# Patient Record
Sex: Female | Born: 1995 | Race: White | Hispanic: No | Marital: Married | Smoking: Never smoker
Health system: Southern US, Community
[De-identification: ages and names within clinical notes are randomized; demographics above are authoritative.]

## PROBLEM LIST (undated history)

## (undated) DIAGNOSIS — Z789 Other specified health status: Secondary | ICD-10-CM

## (undated) HISTORY — PX: NO PAST SURGERIES: SHX2092

---

## 2015-11-30 ENCOUNTER — Emergency Department
Admission: EM | Admit: 2015-11-30 | Discharge: 2015-11-30 | Disposition: A | Discharge status: Home / Self Care | Payer: Medicaid Other | Attending: Emergency Medicine | Admitting: Emergency Medicine

## 2015-11-30 ENCOUNTER — Encounter: Payer: Self-pay | Admitting: Emergency Medicine

## 2015-11-30 ENCOUNTER — Emergency Department: Payer: Medicaid Other

## 2015-11-30 DIAGNOSIS — N898 Other specified noninflammatory disorders of vagina: Secondary | ICD-10-CM | POA: Diagnosis not present

## 2015-11-30 DIAGNOSIS — R1084 Generalized abdominal pain: Secondary | ICD-10-CM | POA: Diagnosis not present

## 2015-11-30 DIAGNOSIS — O26891 Other specified pregnancy related conditions, first trimester: Secondary | ICD-10-CM | POA: Diagnosis present

## 2015-11-30 DIAGNOSIS — Z3A01 Less than 8 weeks gestation of pregnancy: Secondary | ICD-10-CM | POA: Insufficient documentation

## 2015-11-30 DIAGNOSIS — O219 Vomiting of pregnancy, unspecified: Secondary | ICD-10-CM | POA: Diagnosis not present

## 2015-11-30 DIAGNOSIS — R1030 Lower abdominal pain, unspecified: Secondary | ICD-10-CM

## 2015-11-30 DIAGNOSIS — R300 Dysuria: Secondary | ICD-10-CM | POA: Insufficient documentation

## 2015-11-30 LAB — URINALYSIS COMPLETE WITH MICROSCOPIC (ARMC ONLY)
Bilirubin Urine: NEGATIVE
Glucose, UA: NEGATIVE mg/dL
Ketones, ur: NEGATIVE mg/dL
Leukocytes, UA: NEGATIVE
Nitrite: NEGATIVE
Protein, ur: NEGATIVE mg/dL
Specific Gravity, Urine: 1.008 (ref 1.005–1.030)
pH: 5 (ref 5.0–8.0)

## 2015-11-30 LAB — COMPREHENSIVE METABOLIC PANEL WITH GFR
ALT: 13 U/L — ABNORMAL LOW (ref 14–54)
AST: 20 U/L (ref 15–41)
Albumin: 4.1 g/dL (ref 3.5–5.0)
Alkaline Phosphatase: 66 U/L (ref 38–126)
Anion gap: 8 (ref 5–15)
BUN: 16 mg/dL (ref 6–20)
CO2: 23 mmol/L (ref 22–32)
Calcium: 9.2 mg/dL (ref 8.9–10.3)
Chloride: 105 mmol/L (ref 101–111)
Creatinine, Ser: 0.82 mg/dL (ref 0.44–1.00)
GFR calc Af Amer: >60 mL/min
GFR calc non Af Amer: >60 mL/min
Glucose, Bld: 80 mg/dL (ref 65–99)
Potassium: 3.6 mmol/L (ref 3.5–5.1)
Sodium: 136 mmol/L (ref 135–145)
Total Bilirubin: 0.4 mg/dL (ref 0.3–1.2)
Total Protein: 7 g/dL (ref 6.5–8.1)

## 2015-11-30 LAB — CBC
HCT: 36.3 % (ref 35.0–47.0)
HEMOGLOBIN: 12.8 g/dL (ref 12.0–16.0)
MCH: 32.5 pg (ref 26.0–34.0)
MCHC: 35.3 g/dL (ref 32.0–36.0)
MCV: 91.9 fL (ref 80.0–100.0)
PLATELETS: 303 10*3/uL (ref 150–440)
RBC: 3.95 MIL/uL (ref 3.80–5.20)
RDW: 13.1 % (ref 11.5–14.5)
WBC: 8.6 10*3/uL (ref 3.6–11.0)

## 2015-11-30 LAB — TYPE AND SCREEN
ABO/RH(D): O POS
Antibody Screen: NEGATIVE

## 2015-11-30 LAB — HCG, QUANTITATIVE, PREGNANCY: hCG, Beta Chain, Quant, S: 15102 m[IU]/mL — ABNORMAL HIGH

## 2015-11-30 MED ORDER — ONDANSETRON HCL 4 MG/2ML IJ SOLN
4.0000 mg | Freq: Once | INTRAMUSCULAR | Status: AC
  Administered 2015-11-30: 4 mg via INTRAVENOUS
  Filled 2015-11-30: qty 2

## 2015-11-30 MED ORDER — SODIUM CHLORIDE 0.9 % IV BOLUS (SEPSIS)
1000.0000 mL | Freq: Once | INTRAVENOUS | Status: AC
  Administered 2015-11-30: 1000 mL via INTRAVENOUS

## 2015-11-30 NOTE — ED Provider Notes (Signed)
[facility] Emergency Department Provider Note  Time seen: 5:44 PM  I have reviewed the triage vital signs and the nursing notes.   HISTORY  Chief Complaint Abdominal Pain    HPI Norma Daniel is a 20 y.o. female with no past medical history approximately 5-[redacted] weeks pregnant by LMP who presents to the emergency department sudden onset of lower abdominal pain. According to the patient one hour prior to arrival she developed sudden onset of severe lower abdominal pain. States nausea with several episodes of vomiting. Denies diarrhea. He does state mild dysuria over the past several days. She has not yet seen an OB as she just found out she was pregnant last week. Denies any prior pregnancies, miscarriages or abortions. Denies fever.  History reviewed. No pertinent past medical history.  There are no active problems to display for this patient.   No past surgical history on file.  Prior to Admission medications   Not on File    No Known Allergies  No family history on file.  Social History Social History  Substance Use Topics  . Smoking status: Not on file  . Smokeless tobacco: Not on file  . Alcohol use Not on file    Review of Systems Constitutional: Negative for fever. Cardiovascular: Negative for chest pain. Respiratory: Negative for shortness of breath. Gastrointestinal: Severe lower abdominal pain. Positive for nausea and vomiting. Negative for diarrhea. Genitourinary: Mild dysuria. Musculoskeletal: Negative for back pain. Neurological: Negative for headache 10-point ROS otherwise negative.  ____________________________________________   PHYSICAL EXAM:  VITAL SIGNS: ED Triage Vitals [11/30/15 1713]  Enc Vitals Group     BP 131/69     Pulse Rate 100     Resp 18     Temp 98.1 F (36.7 C)     Temp Source Oral     SpO2 100 %     Weight 140 lb (63.5 kg)     Height 5\' 2"  (1.575 m)     Head Circumference      Peak Flow      Pain  Score 7     Pain Loc      Pain Edu?      Excl. in GC?     Constitutional: Alert and oriented. Well appearing and in no distress. Eyes: Normal exam ENT   Head: Normocephalic and atraumatic.   Mouth/Throat: Mucous membranes are moist. Cardiovascular: Normal rate, regular rhythm. No murmurs, Respiratory: Normal respiratory effort without tachypnea nor retractions. Breath sounds are clear  Gastrointestinal: Soft, moderate diffuse abdominal tenderness palpation. No rebound or guarding. No distention. States the pain is somewhat worse with lower abdominal palpation but is tender throughout the abdomen. Musculoskeletal: Nontender with normal range of motion in all extremities.  Neurologic:  Normal speech and language. No gross focal neurologic deficits a Skin:  Skin is warm, dry and intact.  Psychiatric: Mood and affect are normal.   ____________________________________________    RADIOLOGY  Ultrasound shows 5 week 5 day IUP.  ____________________________________________   INITIAL IMPRESSION / ASSESSMENT AND PLAN / ED COURSE  Pertinent labs & imaging results that were available during my care of the patient were reviewed by me and considered in my medical decision making (see chart for details).  The patient presents to the emergency department lower abdominal pain, sudden onset 1 hour ago. Patient is approximately [redacted] weeks pregnant. We will check labs, ultrasound to further evaluate. Bedside ultrasound appears to show a gestational sac within the uterus, no significant free  fluid identified, negative FAST exam.  Ultrasound shows 5 week 5 day IUP. Otherwise normal ultrasound. Patient states the pain is gone. Blood work is largely within normal limits. Currently awaiting urinalysis results.  Urinalysis is negative. We'll discharge the OB follow-up. Patient agreeable to plan.  ____________________________________________   FINAL CLINICAL IMPRESSION(S) / ED  DIAGNOSES  Abdominal pain during pregnancy    Minna AntisKevin Cheresa Siers, MD 11/30/15 2039

## 2015-11-30 NOTE — Discharge Instructions (Signed)
Please call OB/GYN to arrange follow-up appointments as possible. Return to the emergency department for any worsening abdominal pain, Vaginal bleeding, or any other symptom personally concerning to yourself.

## 2015-11-30 NOTE — ED Triage Notes (Addendum)
Pt presents to ED with reports of sudden onset generalized abdominal pain that began approximately one hour ago. Pt reports she is pregnant but does not know gestational age. Pt reports positive pregnancy test at home. Pt denies vaginal bleeding.

## 2016-01-06 NOTE — L&D Delivery Note (Signed)
Delivery Note At 5:05 PM a viable and healthy female "Norma Daniel" was delivered via Vaginal, Spontaneous Delivery (Presentation: OA  ).  APGAR: 9, 9; weight  .   Placenta status:spontaneous, intact  Cord:3vc  with the following complications: tight nuchal.    Anesthesia:  local Episiotomy: None Lacerations:  1st deg in the post fornix, not including the perineal skin. Suture Repair: 3.0 vicryl Est. Blood Loss (mL):  300  Mom to postpartum.  Baby to Couplet care / Skin to Skin.  21yo G1P0 at 38+3wks presenting with PROM for clear fluid at 2pm. She was given buccal cytotec and proceeded to active labor without further augmentation. She began to involuntarily push at 9cm and with a forebag, which was ruptured. She then delivered the fetal head and promptly the fetal body, delivering right through the nuchal cord. There was terminal mec and the baby was passed to maternal abdomen. When cord pulsing subsided, the cord was clamped and cut by FOB. A small vaginal tear was repaired with 2 interuptted stiches and 10ml 1% lidocaine. Her placenta delivered spontaneously and was intact. A speculum was used to evaluate the cervix, which appeared intact. Her fundus was firm and bleeding minimal, with postpartum pitocin running. Everybody tolerated the procedure well.  Norma Daniel 07/12/2016, 5:28 PM

## 2016-01-11 ENCOUNTER — Emergency Department
Admission: EM | Admit: 2016-01-11 | Discharge: 2016-01-11 | Disposition: A | Discharge status: Home / Self Care | Payer: Medicaid Other | Attending: Emergency Medicine | Admitting: Emergency Medicine

## 2016-01-11 ENCOUNTER — Encounter: Payer: Self-pay | Admitting: Emergency Medicine

## 2016-01-11 ENCOUNTER — Emergency Department: Payer: Medicaid Other

## 2016-01-11 DIAGNOSIS — Z3A12 12 weeks gestation of pregnancy: Secondary | ICD-10-CM | POA: Insufficient documentation

## 2016-01-11 DIAGNOSIS — O26891 Other specified pregnancy related conditions, first trimester: Secondary | ICD-10-CM | POA: Insufficient documentation

## 2016-01-11 DIAGNOSIS — R109 Unspecified abdominal pain: Secondary | ICD-10-CM

## 2016-01-11 DIAGNOSIS — R102 Pelvic and perineal pain: Secondary | ICD-10-CM | POA: Diagnosis not present

## 2016-01-11 LAB — URINALYSIS, COMPLETE (UACMP) WITH MICROSCOPIC
BACTERIA UA: NONE SEEN
BILIRUBIN URINE: NEGATIVE
Glucose, UA: NEGATIVE mg/dL
HGB URINE DIPSTICK: NEGATIVE
Ketones, ur: 5 mg/dL — AB
Leukocytes, UA: NEGATIVE
NITRITE: NEGATIVE
PROTEIN: NEGATIVE mg/dL
SPECIFIC GRAVITY, URINE: 1.005 (ref 1.005–1.030)
pH: 6 (ref 5.0–8.0)

## 2016-01-11 LAB — LIPASE, BLOOD: LIPASE: 23 U/L (ref 11–51)

## 2016-01-11 LAB — COMPREHENSIVE METABOLIC PANEL
ALBUMIN: 3.2 g/dL — AB (ref 3.5–5.0)
ALT: 12 U/L — ABNORMAL LOW (ref 14–54)
ANION GAP: 8 (ref 5–15)
AST: 21 U/L (ref 15–41)
Alkaline Phosphatase: 41 U/L (ref 38–126)
BILIRUBIN TOTAL: 0.4 mg/dL (ref 0.3–1.2)
BUN: 11 mg/dL (ref 6–20)
CHLORIDE: 105 mmol/L (ref 101–111)
CO2: 21 mmol/L — AB (ref 22–32)
Calcium: 8.7 mg/dL — ABNORMAL LOW (ref 8.9–10.3)
Creatinine, Ser: 0.58 mg/dL (ref 0.44–1.00)
GFR calc Af Amer: >60 mL/min (ref >60)
GFR calc non Af Amer: >60 mL/min (ref >60)
GLUCOSE: 96 mg/dL (ref 65–99)
POTASSIUM: 3.6 mmol/L (ref 3.5–5.1)
SODIUM: 134 mmol/L — AB (ref 135–145)
Total Protein: 6 g/dL — ABNORMAL LOW (ref 6.5–8.1)

## 2016-01-11 LAB — CBC
HEMATOCRIT: 33.2 % — AB (ref 35.0–47.0)
Hemoglobin: 11.7 g/dL — ABNORMAL LOW (ref 12.0–16.0)
MCH: 32.2 pg (ref 26.0–34.0)
MCHC: 35.4 g/dL (ref 32.0–36.0)
MCV: 91.1 fL (ref 80.0–100.0)
Platelets: 191 10*3/uL (ref 150–440)
RBC: 3.64 MIL/uL — ABNORMAL LOW (ref 3.80–5.20)
RDW: 13.2 % (ref 11.5–14.5)
WBC: 6 10*3/uL (ref 3.6–11.0)

## 2016-01-11 LAB — HCG, QUANTITATIVE, PREGNANCY: HCG, BETA CHAIN, QUANT, S: 102198 m[IU]/mL — AB (ref <5)

## 2016-01-11 LAB — ABO/RH: ABO/RH(D): O POS

## 2016-01-11 NOTE — ED Notes (Signed)
Patient transported to Ultrasound 

## 2016-01-11 NOTE — ED Triage Notes (Signed)
Pt to rm 11 via EMS, ems report n/v/d and lower abd pain x 2 days, reports looked pale, possible near syncopal episode.  Pt [redacted] weeks pregnant.  VSS, BG 149.

## 2016-01-11 NOTE — ED Provider Notes (Signed)
[facility] Emergency Department Provider Note   ____________________________________________    I have reviewed the triage vital signs and the nursing notes.   HISTORY  Chief Complaint Abdominal Pain; Emesis; and Diarrhea     HPI Norma Daniel is a 21 y.o. female who presents with complaints of abdominal pain. Patient reports worsening lower abdominal pain over the past 2 days. She also reports nausea vomiting and diarrhea. She denies history of abdominal surgery. She is [redacted] weeks pregnant. She did have an ultrasound 1 month ago which demonstrated an IUP. She denies vaginal bleeding. No fevers or chills reported. No sick contacts reported. No dysuria reported   History reviewed. No pertinent past medical history.  There are no active problems to display for this patient.   History reviewed. No pertinent surgical history.  Prior to Admission medications   Not on File     Allergies Patient has no known allergies.  History reviewed. No pertinent family history.  Social History Social History  Substance Use Topics  . Smoking status: Never Smoker  . Smokeless tobacco: Never Used  . Alcohol use No    Review of Systems  Constitutional: No fever/chills  ENT: No sore throat. Cardiovascular: Denies chest pain. Respiratory: Denies Cough Gastrointestinal:As above  Genitourinary: Negative for dysuria. Musculoskeletal: Negative for back pain. Skin: Negative for rash. Neurological: Negative for headaches   10-point ROS otherwise negative.  ____________________________________________   PHYSICAL EXAM:  VITAL SIGNS: ED Triage Vitals  Enc Vitals Group     BP 01/11/16 0707 101/73     Pulse Rate 01/11/16 0707 83     Resp 01/11/16 0707 20     Temp 01/11/16 0707 98.1 F (36.7 C)     Temp Source 01/11/16 0707 Oral     SpO2 01/11/16 0707 99 %     Weight 01/11/16 0704 150 lb (68 kg)     Height 01/11/16 0704 5\' 2"  (1.575 m)     Head  Circumference --      Peak Flow --      Pain Score 01/11/16 0715 8     Pain Loc --      Pain Edu? --      Excl. in GC? --     Constitutional: Alert and oriented. No acute distress. Pleasant and interactive Eyes: Conjunctivae are normal.  Head: Atraumatic. Nose: No congestion/rhinnorhea. Mouth/Throat: Mucous membranes are moist.    Cardiovascular: Normal rate, regular rhythm. Grossly normal heart sounds.  Good peripheral circulation. Respiratory: Normal respiratory effort.  No retractions. Lungs CTAB. Gastrointestinal: Mild tenderness in the right lower quadrant and left lower quadrant. No distention.  No CVA tenderness. Genitourinary: deferred Musculoskeletal: No lower extremity tenderness nor edema.  Warm and well perfused Neurologic:  Normal speech and language.   Skin:  Skin is warm, dry and intact. No rash noted. Psychiatric: Mood and affect are normal. Speech and behavior are normal.  ____________________________________________   LABS (all labs ordered are listed, but only abnormal results are displayed)  Labs Reviewed  CBC - Abnormal; Notable for the following:       Result Value   RBC 3.64 (*)    Hemoglobin 11.7 (*)    HCT 33.2 (*)    All other components within normal limits  LIPASE, BLOOD  COMPREHENSIVE METABOLIC PANEL  URINALYSIS, COMPLETE (UACMP) WITH MICROSCOPIC  HCG, QUANTITATIVE, PREGNANCY  ABO/RH   ____________________________________________  EKG  None ____________________________________________  RADIOLOGY  Ultrasound is unremarkable ____________________________________________   PROCEDURES  Procedure(s) performed: No    Critical Care performed: No ____________________________________________   INITIAL IMPRESSION / ASSESSMENT AND PLAN / ED COURSE  Pertinent labs & imaging results that were available during my care of the patient were reviewed by me and considered in my medical decision making (see chart for details).  Patient with  lower abdominal cramping, bilaterally. Differential includes miscarriage, appendicitis, gastroenteritis. We will check labs and ultrasound to start and consider more advanced imaging if necessary  ----------------------------------------- 10:57 AM on 01/11/2016 -----------------------------------------  Lab tests are reassuring, urine is normal, ultrasound unremarkable although right ovary visualized unfortunately. However the patient reports her pain has essentially resolved. I suspect viral illness primarily. On reexam she has no tenderness to palpation. Do not feel imaging is necessary at this time, we did discuss return precautions at length. ____________________________________________   FINAL CLINICAL IMPRESSION(S) / ED DIAGNOSES  Final diagnoses:  Pelvic pain  Abdominal pain during pregnancy in first trimester      NEW MEDICATIONS STARTED DURING THIS VISIT:  New Prescriptions   No medications on file     Note:  This document was prepared using Dragon voice recognition software and may include unintentional dictation errors.    Jene Every, MD 01/11/16 1058

## 2016-01-11 NOTE — ED Notes (Signed)
Put hat in pt's toilet and told her we needed urine; pt unable to void at this time.

## 2016-01-11 NOTE — ED Notes (Addendum)
E-signature pad not working. Pt states she has a F/U appt on Tuesday with OB.

## 2016-01-17 ENCOUNTER — Other Ambulatory Visit: Payer: Self-pay | Admitting: Family Medicine

## 2016-01-17 DIAGNOSIS — Z3401 Encounter for supervision of normal first pregnancy, first trimester: Secondary | ICD-10-CM

## 2016-01-27 ENCOUNTER — Other Ambulatory Visit: Payer: Self-pay | Admitting: Family Medicine

## 2016-01-27 ENCOUNTER — Ambulatory Visit
Admission: RE | Admit: 2016-01-27 | Discharge: 2016-01-27 | Disposition: A | Discharge status: Home / Self Care | Payer: Medicaid Other | Source: Ambulatory Visit | Attending: Family Medicine | Admitting: Family Medicine

## 2016-01-27 DIAGNOSIS — Z3689 Encounter for other specified antenatal screening: Secondary | ICD-10-CM

## 2016-01-27 DIAGNOSIS — Z3401 Encounter for supervision of normal first pregnancy, first trimester: Secondary | ICD-10-CM

## 2016-03-02 ENCOUNTER — Ambulatory Visit
Admission: RE | Admit: 2016-03-02 | Discharge: 2016-03-02 | Disposition: A | Discharge status: Home / Self Care | Payer: Medicaid Other | Source: Ambulatory Visit | Attending: Family Medicine | Admitting: Family Medicine

## 2016-03-02 DIAGNOSIS — Z3402 Encounter for supervision of normal first pregnancy, second trimester: Secondary | ICD-10-CM | POA: Insufficient documentation

## 2016-03-02 DIAGNOSIS — Z3689 Encounter for other specified antenatal screening: Secondary | ICD-10-CM

## 2016-03-02 DIAGNOSIS — Z3A19 19 weeks gestation of pregnancy: Secondary | ICD-10-CM | POA: Insufficient documentation

## 2016-07-11 ENCOUNTER — Inpatient Hospital Stay
Admission: EM | Admit: 2016-07-11 | Discharge: 2016-07-14 | DRG: 775 | Disposition: A | Discharge status: Home / Self Care | Payer: Medicaid Other | Attending: Obstetrics and Gynecology | Admitting: Obstetrics and Gynecology

## 2016-07-11 DIAGNOSIS — O4290 Premature rupture of membranes, unspecified as to length of time between rupture and onset of labor, unspecified weeks of gestation: Principal | ICD-10-CM | POA: Diagnosis present

## 2016-07-11 DIAGNOSIS — Z3A38 38 weeks gestation of pregnancy: Secondary | ICD-10-CM

## 2016-07-11 DIAGNOSIS — D72829 Elevated white blood cell count, unspecified: Secondary | ICD-10-CM | POA: Diagnosis present

## 2016-07-11 LAB — ROM PLUS (ARMC ONLY): Rom Plus: POSITIVE

## 2016-07-11 MED ORDER — MISOPROSTOL 25 MCG QUARTER TABLET
25.0000 ug | ORAL_TABLET | ORAL | Status: DC | PRN
  Administered 2016-07-12 (×2): 25 ug via BUCCAL
  Filled 2016-07-11 (×2): qty 1

## 2016-07-11 MED ORDER — DOCUSATE SODIUM 100 MG PO CAPS
100.0000 mg | ORAL_CAPSULE | Freq: Every day | ORAL | Status: DC
  Administered 2016-07-13: 100 mg via ORAL
  Filled 2016-07-11: qty 1

## 2016-07-11 MED ORDER — ZOLPIDEM TARTRATE 5 MG PO TABS
5.0000 mg | ORAL_TABLET | Freq: Every evening | ORAL | Status: DC | PRN
Start: 2016-07-11 — End: 2016-07-12

## 2016-07-11 MED ORDER — TERBUTALINE SULFATE 1 MG/ML IJ SOLN: 0.2500 mg | Freq: Once | INTRAMUSCULAR | Status: DC | PRN

## 2016-07-11 MED ORDER — PRENATAL PLUS 27-1 MG PO TABS
1.0000 | ORAL_TABLET | Freq: Every day | ORAL | Status: DC
  Administered 2016-07-13: 1 via ORAL
  Filled 2016-07-11 (×4): qty 1

## 2016-07-11 MED ORDER — ACETAMINOPHEN 325 MG PO TABS
650.0000 mg | ORAL_TABLET | ORAL | Status: DC | PRN
  Administered 2016-07-13: 650 mg via ORAL
  Filled 2016-07-11: qty 2

## 2016-07-11 MED ORDER — OXYTOCIN 40 UNITS IN LACTATED RINGERS INFUSION - SIMPLE MED: 1.0000 m[IU]/min | INTRAVENOUS | Status: DC

## 2016-07-11 MED ORDER — CALCIUM CARBONATE ANTACID 500 MG PO CHEW: 2.0000 | CHEWABLE_TABLET | ORAL | Status: DC | PRN

## 2016-07-11 NOTE — H&P (Signed)
OB ADMISSION/ HISTORY & PHYSICAL:  Admission Date: 07/11/2016 10:24 PM  Admit Diagnosis: PROM at 38wks  Norma Daniel is a 21 y.o. female presenting for PROM for clear fluid at 1400 on 07/11/16.  Prenatal History: G1P0   EDC : 07/23/2016,  Primary Ob Provider: Phineas Realharles Drew Prenatal course complicated by nothing per patient, but we don't have access to any labs today  Prenatal Labs: ABO, Rh: --/--/O POS (07/08 0335) Antibody: NEG (07/08 0335) Rubella:    RPR:    HBsAg: Negative (07/08 0000)  HIV: Non-reactive (07/08 0000)  GTT: unknown GBS:   unknown  Medical / Surgical History :  Past medical history: History reviewed. No pertinent past medical history.   Past surgical history: History reviewed. No pertinent surgical history.  Family History: No family history on file.   Social History:  reports that she has never smoked. She has never used smokeless tobacco. She reports that she does not drink alcohol. Her drug history is not on file.   Allergies: Patient has no known allergies.    Current Medications at time of admission:  Prior to Admission medications   Medication Sig Start Date End Date Taking? Authorizing Provider  Prenatal Vit-Fe Fumarate-FA (PRENATAL MULTIVITAMIN) TABS tablet Take 1 tablet by mouth daily at 12 noon.   Yes [provider]     Review of Systems: Active FM PROM clear fluid  Physical Exam:  VS: Blood pressure 121/79, pulse 91, temperature 98.4 F (36.9 C), temperature source Oral, resp. rate 16, height 5\' 2"  (1.575 m), weight 175 lb (79.4 kg), last menstrual period 10/18/2015.  General: alert and oriented, appears in mild distress with ctx Heart: RRR Lungs: Clear lung fields Abdomen: Gravid, soft and non-tender, non-distended Extremities: trace edema  Genitalia / VE: Dilation: 1.5 Effacement (%): 40 Station: -3 Exam by:: McSween, RN  FHR: baseline rate 135 / variability mod / accelerations + / no decelerations TOCO: non  contractions  Last US Growth scan 06/15/16 at 34wks, 14% EFW and normal fluid 10cm, cephalic, normal anatomy. Echogenic focus in heart  Assessment: 38+[redacted] weeks gestation 1 stage of labor FHR category 1   Plan:   Admit for admission Labs pending Epidural when desired Continuous fetal monitoring   1. Fetal Well being  - Fetal Tracing: Cat I - Ultrasound:  reviewed, as above - Group B Streptococcus: unknown- will treat if not delivered by 18hrs after rupture per guidelines - Presentation: cephalic confirmed by leopolds   2. Routine OB: - Prenatal labs reviewed, as above - Rh O pos  3. Induction of Labor:  -  Contractions external toco in place -  Plan for induction with buccal cytotec  4. Post Partum Planning: - Infant feeding: breast  5. Leukocytosis present on admission: WBC 14.5, no fundal tenderness, afebrile.

## 2016-07-12 DIAGNOSIS — O4290 Premature rupture of membranes, unspecified as to length of time between rupture and onset of labor, unspecified weeks of gestation: Secondary | ICD-10-CM

## 2016-07-12 DIAGNOSIS — Z3A38 38 weeks gestation of pregnancy: Secondary | ICD-10-CM | POA: Diagnosis not present

## 2016-07-12 DIAGNOSIS — D72829 Elevated white blood cell count, unspecified: Secondary | ICD-10-CM | POA: Diagnosis present

## 2016-07-12 DIAGNOSIS — O4292 Full-term premature rupture of membranes, unspecified as to length of time between rupture and onset of labor: Secondary | ICD-10-CM | POA: Diagnosis present

## 2016-07-12 LAB — CBC WITH DIFFERENTIAL/PLATELET
Basophils Absolute: 0 10*3/uL (ref 0–0.1)
Basophils Relative: 0 %
EOS ABS: 0.1 10*3/uL (ref 0–0.7)
EOS PCT: 1 %
HCT: 31.3 % — ABNORMAL LOW (ref 35.0–47.0)
HEMOGLOBIN: 11.1 g/dL — AB (ref 12.0–16.0)
LYMPHS ABS: 2.6 10*3/uL (ref 1.0–3.6)
Lymphocytes Relative: 18 %
MCH: 33 pg (ref 26.0–34.0)
MCHC: 35.6 g/dL (ref 32.0–36.0)
MCV: 92.7 fL (ref 80.0–100.0)
MONO ABS: 0.9 10*3/uL (ref 0.2–0.9)
MONOS PCT: 6 %
Neutro Abs: 10.9 10*3/uL — ABNORMAL HIGH (ref 1.4–6.5)
Neutrophils Relative %: 75 %
PLATELETS: 238 10*3/uL (ref 150–440)
RBC: 3.38 MIL/uL — ABNORMAL LOW (ref 3.80–5.20)
RDW: 12.9 % (ref 11.5–14.5)
WBC: 14.5 10*3/uL — ABNORMAL HIGH (ref 3.6–11.0)

## 2016-07-12 LAB — RAPID HIV SCREEN (HIV 1/2 AB+AG)
HIV 1/2 ANTIBODIES: NONREACTIVE
HIV-1 P24 ANTIGEN - HIV24: NONREACTIVE

## 2016-07-12 LAB — CHLAMYDIA/NGC RT PCR (ARMC ONLY)
Chlamydia Tr: NOT DETECTED
N GONORRHOEAE: NOT DETECTED

## 2016-07-12 LAB — TYPE AND SCREEN
ABO/RH(D): O POS
ANTIBODY SCREEN: NEGATIVE

## 2016-07-12 LAB — OB RESULTS CONSOLE VARICELLA ZOSTER ANTIBODY, IGG: Varicella: IMMUNE

## 2016-07-12 LAB — OB RESULTS CONSOLE HIV ANTIBODY (ROUTINE TESTING): HIV: NONREACTIVE

## 2016-07-12 LAB — OB RESULTS CONSOLE HEPATITIS B SURFACE ANTIGEN: HEP B S AG: NEGATIVE

## 2016-07-12 MED ORDER — CEFAZOLIN SODIUM-DEXTROSE 2-4 GM/100ML-% IV SOLN
2.0000 g | Freq: Once | INTRAVENOUS | Status: AC
  Administered 2016-07-12: 2 g via INTRAVENOUS
  Filled 2016-07-12: qty 100

## 2016-07-12 MED ORDER — BENZOCAINE-MENTHOL 20-0.5 % EX AERO
INHALATION_SPRAY | CUTANEOUS | Status: AC
  Administered 2016-07-12: 18:00:00
  Filled 2016-07-12: qty 56

## 2016-07-12 MED ORDER — DIPHENHYDRAMINE HCL 25 MG PO CAPS: 25.0000 mg | ORAL_CAPSULE | Freq: Four times a day (QID) | ORAL | Status: DC | PRN

## 2016-07-12 MED ORDER — METHYLERGONOVINE MALEATE 0.2 MG/ML IJ SOLN: 0.2000 mg | INTRAMUSCULAR | Status: DC | PRN

## 2016-07-12 MED ORDER — FLEET ENEMA 7-19 GM/118ML RE ENEM
1.0000 | ENEMA | Freq: Every day | RECTAL | Status: DC | PRN
Start: 2016-07-12 — End: 2016-07-14

## 2016-07-12 MED ORDER — HYDROMORPHONE HCL 1 MG/ML IJ SOLN
INTRAMUSCULAR | Status: AC
  Administered 2016-07-12: 1 mg
  Filled 2016-07-12: qty 1

## 2016-07-12 MED ORDER — ONDANSETRON HCL 4 MG PO TABS: 4.0000 mg | ORAL_TABLET | ORAL | Status: DC | PRN

## 2016-07-12 MED ORDER — OXYTOCIN 10 UNIT/ML IJ SOLN
INTRAMUSCULAR | Status: AC
  Filled 2016-07-12: qty 2

## 2016-07-12 MED ORDER — MISOPROSTOL 200 MCG PO TABS
ORAL_TABLET | ORAL | Status: AC
  Filled 2016-07-12: qty 4

## 2016-07-12 MED ORDER — COCONUT OIL OIL: 1.0000 "application " | TOPICAL_OIL | Status: DC | PRN

## 2016-07-12 MED ORDER — DEXTROSE 5 % IV SOLN
5.0000 10*6.[IU] | Freq: Once | INTRAVENOUS | Status: AC
  Administered 2016-07-12: 5 10*6.[IU] via INTRAVENOUS
  Filled 2016-07-12: qty 5

## 2016-07-12 MED ORDER — ONDANSETRON HCL 4 MG/2ML IJ SOLN: 4.0000 mg | INTRAMUSCULAR | Status: DC | PRN

## 2016-07-12 MED ORDER — AMMONIA AROMATIC IN INHA
RESPIRATORY_TRACT | Status: AC
  Filled 2016-07-12: qty 10

## 2016-07-12 MED ORDER — BENZOCAINE-MENTHOL 20-0.5 % EX AERO: 1.0000 "application " | INHALATION_SPRAY | CUTANEOUS | Status: DC | PRN

## 2016-07-12 MED ORDER — MISOPROSTOL 200 MCG PO TABS
800.0000 ug | ORAL_TABLET | Freq: Once | ORAL | Status: AC
  Administered 2016-07-12: 800 ug via RECTAL

## 2016-07-12 MED ORDER — LACTATED RINGERS IV SOLN
INTRAVENOUS | Status: DC
  Administered 2016-07-12 (×2): via INTRAVENOUS

## 2016-07-12 MED ORDER — SODIUM CHLORIDE 0.9% FLUSH: 3.0000 mL | Freq: Two times a day (BID) | INTRAVENOUS | Status: DC

## 2016-07-12 MED ORDER — WITCH HAZEL-GLYCERIN EX PADS: 1.0000 "application " | MEDICATED_PAD | CUTANEOUS | Status: DC | PRN

## 2016-07-12 MED ORDER — BUTORPHANOL TARTRATE 2 MG/ML IJ SOLN
1.0000 mg | INTRAMUSCULAR | Status: DC | PRN
  Filled 2016-07-12: qty 1

## 2016-07-12 MED ORDER — OXYTOCIN 40 UNITS IN LACTATED RINGERS INFUSION - SIMPLE MED
INTRAVENOUS | Status: AC
  Administered 2016-07-12: 500 mL/h
  Filled 2016-07-12: qty 1000

## 2016-07-12 MED ORDER — MEASLES, MUMPS & RUBELLA VAC ~~LOC~~ INJ: 0.5000 mL | INJECTION | Freq: Once | SUBCUTANEOUS | Status: DC

## 2016-07-12 MED ORDER — LIDOCAINE HCL (PF) 1 % IJ SOLN
INTRAMUSCULAR | Status: AC
  Administered 2016-07-12: 17:00:00
  Filled 2016-07-12: qty 30

## 2016-07-12 MED ORDER — SIMETHICONE 80 MG PO CHEW: 80.0000 mg | CHEWABLE_TABLET | ORAL | Status: DC | PRN

## 2016-07-12 MED ORDER — TETANUS-DIPHTH-ACELL PERTUSSIS 5-2.5-18.5 LF-MCG/0.5 IM SUSP: 0.5000 mL | Freq: Once | INTRAMUSCULAR | Status: DC

## 2016-07-12 MED ORDER — OXYTOCIN 40 UNITS IN LACTATED RINGERS INFUSION - SIMPLE MED
INTRAVENOUS | Status: AC
  Administered 2016-07-12: 18:00:00
  Filled 2016-07-12: qty 1000

## 2016-07-12 MED ORDER — PENICILLIN G POTASSIUM 5000000 UNITS IJ SOLR
2.5000 10*6.[IU] | INTRAVENOUS | Status: DC
  Filled 2016-07-12 (×6): qty 2.5

## 2016-07-12 MED ORDER — IBUPROFEN 600 MG PO TABS
600.0000 mg | ORAL_TABLET | Freq: Four times a day (QID) | ORAL | Status: DC
  Administered 2016-07-12 – 2016-07-14 (×6): 600 mg via ORAL
  Filled 2016-07-12 (×5): qty 1

## 2016-07-12 MED ORDER — IBUPROFEN 600 MG PO TABS
ORAL_TABLET | ORAL | Status: AC
  Administered 2016-07-12: 600 mg via ORAL
  Filled 2016-07-12: qty 1

## 2016-07-12 MED ORDER — SODIUM CHLORIDE 0.9% FLUSH
3.0000 mL | INTRAVENOUS | Status: DC | PRN
  Administered 2016-07-13 – 2016-07-14 (×2): 3 mL via INTRAVENOUS
  Filled 2016-07-12 (×2): qty 3

## 2016-07-12 MED ORDER — DIBUCAINE 1 % RE OINT
1.0000 | TOPICAL_OINTMENT | RECTAL | Status: DC | PRN
Start: 2016-07-12 — End: 2016-07-14
  Filled 2016-07-12: qty 28

## 2016-07-12 MED ORDER — SENNOSIDES-DOCUSATE SODIUM 8.6-50 MG PO TABS
2.0000 | ORAL_TABLET | ORAL | Status: DC
  Administered 2016-07-13: 2 via ORAL
  Filled 2016-07-12: qty 2

## 2016-07-12 MED ORDER — BISACODYL 10 MG RE SUPP: 10.0000 mg | Freq: Every day | RECTAL | Status: DC | PRN

## 2016-07-12 MED ORDER — METHYLERGONOVINE MALEATE 0.2 MG/ML IJ SOLN
INTRAMUSCULAR | Status: AC
  Administered 2016-07-12: 0.2 mg via INTRAMUSCULAR
  Filled 2016-07-12: qty 1

## 2016-07-12 MED ORDER — ONDANSETRON HCL 4 MG/2ML IJ SOLN
8.0000 mg | Freq: Three times a day (TID) | INTRAMUSCULAR | Status: DC | PRN
  Administered 2016-07-12: 8 mg via INTRAVENOUS

## 2016-07-12 MED ORDER — ONDANSETRON HCL 4 MG/2ML IJ SOLN
INTRAMUSCULAR | Status: AC
  Administered 2016-07-12: 8 mg via INTRAVENOUS
  Filled 2016-07-12: qty 4

## 2016-07-12 MED ORDER — MISOPROSTOL 25 MCG QUARTER TABLET
50.0000 ug | ORAL_TABLET | ORAL | Status: DC | PRN
  Administered 2016-07-12: 50 ug via BUCCAL

## 2016-07-12 MED ORDER — SODIUM CHLORIDE 0.9 % IV SOLN: 250.0000 mL | INTRAVENOUS | Status: DC | PRN

## 2016-07-12 MED ORDER — MISOPROSTOL 25 MCG QUARTER TABLET
ORAL_TABLET | ORAL | Status: AC
  Administered 2016-07-12: 50 ug via BUCCAL
  Filled 2016-07-12: qty 2

## 2016-07-12 MED ORDER — FENTANYL CITRATE (PF) 100 MCG/2ML IJ SOLN
INTRAMUSCULAR | Status: AC
  Administered 2016-07-12: 50 ug
  Filled 2016-07-12: qty 2

## 2016-07-12 NOTE — Progress Notes (Addendum)
After delivery, continuous clots noted from vagina. 1mg  dilaudid given iv and manual exploration of the fund revealed firm fundus with 2cm of retained clots. The vag lac was re-repaired. Several more fundal sweeps revealed clots but no tissue and the LUS was firm. 0.2mg  methergine given in left thigh and 800mcg Cytotec given rectally. Pitocin continued. Eval of the cervix again without obvious laceration. 50 mcg iv fentanyl given. If bleeding continues, patient is aware that we will need to proceed to OR for exam under anesthesia. EBL another 800 likely, but will do quantitative evaluation, though most of it in original pads.   1 dose ancef given IV

## 2016-07-12 NOTE — Discharge Summary (Signed)
Obstetrical Discharge Summary  Patient Name: Norma Daniel DOB: 26-Feb-1995 MRN: [id number]  Date of Admission: 07/11/2016 Date of Discharge: 07/14/16 Primary OB: [facility]  Gestational Age at Delivery: 1984w3d   Antepartum complications:  - No records available - ECF - size less than dates  Admitting Diagnosis: PROM Secondary Diagnosis: Patient Active Problem List   Diagnosis Date Noted  . Premature rupture of membranes, antepartum 07/12/2016  . Premature rupture of membranes 07/12/2016    Augmentation: Cytotec Complications: None Intrapartum complications/course:  21yo G1P0 at 38+3wks presenting with PROM for clear fluid at 2pm. She was given buccal cytotec and proceeded to active labor without further augmentation. She began to involuntarily push at 9cm and with a forebag, which was ruptured. She then delivered the fetal head and promptly the fetal body, delivering right through the nuchal cord. There was terminal mec and the baby was passed to maternal abdomen. When cord pulsing subsided, the cord was clamped and cut by FOB. A small vaginal tear was repaired with 2 interuptted stiches and 10ml 1% lidocaine. Her placenta delivered spontaneously and was intact. A speculum was used to evaluate the cervix, which appeared intact. Her fundus was firm and bleeding minimal, with postpartum pitocin running. Everybody tolerated the procedure well.  Date of Delivery: 07/12/16 Delivered By: Christeen DouglasBethany Beasley  Delivery Type: spontaneous vaginal delivery Anesthesia: local for repair Placenta: Spontaneous Laceration: 1st deg Episiotomy: none Newborn Data: Live born female "Declan" Birth Weight:   APGAR: 9, 9    Discharge Physical Exam: 07/14/16 BP 113/61 (BP Location: Right Arm)   Pulse 67   Temp 98.3 F (36.8 C) (Oral)   Resp 16   Ht 5\' 2"  (1.575 m)   Wt 79.4 kg (175 lb)   LMP 10/18/2015   SpO2 100%   Breastfeeding? Unknown   BMI 32.01 kg/m   General: NAD CV: RRR Pulm:  CTABL, nl effort ABD: s/nd/nt, fundus firm and below the umbilicus Lochia: moderate Incision:n/a DVT Evaluation: LE non-ttp, no evidence of DVT on exam.  Hemoglobin  Date Value Ref Range Status  07/14/2016 10.1 (L) 12.0 - 16.0 g/dL Final   HCT  Date Value Ref Range Status  07/14/2016 29.0 (L) 35.0 - 47.0 % Final    Post partum course: uncomplicated Postpartum Procedures: none Disposition: stable, discharge to home. Baby Feeding: breastmilk Baby Disposition: home with mom  Rh Immune globulin given:n/a Rubella vaccine given:  Tdap vaccine given in AP or PP setting:  Flu vaccine given in AP or PP setting:   Contraception:micronor Prenatal Labs:  O positive   Plan:  Norma Daniel was discharged to home in good condition. [facility] 6 weeks or as needed   Discharge Medications: Allergies as of 07/14/2016   No Known Allergies     Medication List    TAKE these medications   ibuprofen 600 MG tablet Commonly known as:  ADVIL,MOTRIN Take 1 tablet (600 mg total) by mouth every 6 (six) hours.   norethindrone 0.35 MG tablet Commonly known as:  ORTHO MICRONOR Take 1 tablet (0.35 mg total) by mouth daily.   prenatal multivitamin Tabs tablet Take 1 tablet by mouth daily at 12 noon.       Follow-up Information    Christeen DouglasBeasley, Bethany, MD Follow up in 6 week(s).   Specialty:  Obstetrics and Gynecology Contact information: [address] [place] [address] [phone]           Signed: Ihor Austinhomas J Schermerhorn  07/14/16

## 2016-07-13 LAB — CBC
HEMATOCRIT: 28.3 % — AB (ref 35.0–47.0)
HEMOGLOBIN: 10.1 g/dL — AB (ref 12.0–16.0)
MCH: 32.8 pg (ref 26.0–34.0)
MCHC: 35.6 g/dL (ref 32.0–36.0)
MCV: 92.2 fL (ref 80.0–100.0)
Platelets: 181 10*3/uL (ref 150–440)
RBC: 3.07 MIL/uL — ABNORMAL LOW (ref 3.80–5.20)
RDW: 12.6 % (ref 11.5–14.5)
WBC: 14.6 10*3/uL — ABNORMAL HIGH (ref 3.6–11.0)

## 2016-07-13 LAB — RPR: RPR: NONREACTIVE

## 2016-07-13 NOTE — Progress Notes (Signed)
Post Partum Day 1 Subjective: no complaints, up ad lib, voiding and tolerating PO  Objective: Blood pressure 110/60, pulse 60, temperature 97.8 F (36.6 C), temperature source Oral, resp. rate 18, height 5\' 2"  (1.575 m), weight 175 lb (79.4 kg), last menstrual period 10/18/2015, SpO2 98 %, unknown if currently breastfeeding.  Physical Exam:  General: alert and cooperative Lochia: appropriate Uterine Fundus: firm DVT Evaluation: No evidence of DVT seen on physical exam. No cords or calf tenderness.   Recent Labs  07/11/16 2326 07/13/16 0447  HGB 11.1* 10.1*  HCT 31.3* 28.3*    Assessment/Plan: Plan for discharge tomorrow and Breastfeeding  Repeat WBC in am. No signs of active infeciton   LOS: 1 day   Christeen DouglasBethany Ranesha Val 07/13/2016, 6:28 PM

## 2016-07-14 LAB — CBC
HEMATOCRIT: 29 % — AB (ref 35.0–47.0)
HEMOGLOBIN: 10.1 g/dL — AB (ref 12.0–16.0)
MCH: 32.4 pg (ref 26.0–34.0)
MCHC: 35 g/dL (ref 32.0–36.0)
MCV: 92.5 fL (ref 80.0–100.0)
PLATELETS: 206 10*3/uL (ref 150–440)
RBC: 3.13 MIL/uL — ABNORMAL LOW (ref 3.80–5.20)
RDW: 12.9 % (ref 11.5–14.5)
WBC: 11.5 10*3/uL — AB (ref 3.6–11.0)

## 2016-07-14 MED ORDER — IBUPROFEN 600 MG PO TABS: 600.0000 mg | ORAL_TABLET | Freq: Four times a day (QID) | ORAL | 0 refills | Status: AC

## 2016-07-14 MED ORDER — NORETHINDRONE 0.35 MG PO TABS: 1.0000 | ORAL_TABLET | Freq: Every day | ORAL | 11 refills | Status: AC

## 2016-07-14 NOTE — Progress Notes (Signed)
Discharge instructions given. Patient verbalizes understanding of teaching. Patient discharged home at 1045.

## 2016-08-27 ENCOUNTER — Ambulatory Visit
Admission: RE | Admit: 2016-08-27 | Discharge: 2016-08-27 | Disposition: A | Discharge status: Home / Self Care | Payer: Managed Care, Other (non HMO) | Source: Ambulatory Visit | Attending: Obstetrics and Gynecology | Admitting: Obstetrics and Gynecology

## 2016-08-27 ENCOUNTER — Other Ambulatory Visit: Payer: Self-pay | Admitting: Obstetrics and Gynecology

## 2016-09-03 NOTE — H&P (Signed)
[name] is a 21 y.o. female G1P1 here for Postpartum Care .  6 weeks s/p NSVD baby boy "Declan" with PROM and fast induction, no chorio, and 1st deg in posterior fourchette repaired with 2-0 vicryl presenting with heavy bleeding x5 days, fever last week (now resolved 97.9), and abdominal pain, now with nausea daily but no vomiting. Her pain at the perineum has improved over right introitus as has her uterine tenderness. No dysuria. She is still bleeding as heavy as a period, and going through a pad an hour.   H/H last week stable. Her beta HCG is negative.  Her ultrasound her was normal last week with a thin stripe.  IMPRESSION: 1. No evidence of retained products of conception. Bilayer endometrial thickness 5 mm, normal. No focal endometrial mass. 2. Normal retroverted uterus. No uterine fibroids. No sonographic evidence of a uterine AVM. 3. Normal ovaries.  No adnexal masses.  Endometrial culture grew no growth, but rare placental cells.  Her bleeding resolved this week with Lysteda, but has now returned.  No intercourse yet.  Breastfeeding 80%, bottles at night.  Mood stable, sleeping well. On micronor for contraception.  Past Medical History:  has no past medical history on file.  Past Surgical History:  has no past surgical history on file. Family History: family history is not on file. Social History:   OB/GYN History:     OB History    No data available      Allergies: has no allergies on file. Medications:  Current Outpatient Prescriptions:  .  amoxicillin-clavulanate (AUGMENTIN) 875-125 mg tablet, Take 1 tablet (875 mg total) by mouth every 12 (twelve) hours for 14 days., Disp: 28 tablet, Rfl: 0 .  ibuprofen (ADVIL,MOTRIN) 600 MG tablet, Take by mouth., Disp: , Rfl:  .  methylergonovine (METHERGINE) 0.2 mg tablet, Take 1 tablet (0.2 mg total) by mouth 4 (four) times daily., Disp: 8 tablet, Rfl: 0 .  norethindrone (MICRONOR) 0.35 mg tablet,  Take by mouth., Disp: , Rfl:  .  tranexamic acid (LYSTEDA) 650 mg tablet, Take 2 tablets (1,300 mg total) by mouth 3 (three) times daily. Take for a maximum of 5 days during monthly menstruation., Disp: 30 tablet, Rfl: 3  Review of Systems: No SOB, no palpitations or chest pain, no new lower extremity edema, no nausea or vomiting or bowel or bladder complaints. See HPI for gyn specific ROS.    Exam:      Vitals:   08/27/16 1120  BP: 116/73  Pulse: 86   Body mass index is 27.46 kg/m.  WDWN female in NAD CV: RRR Pulm: CTAB  Breast: deferred  Abdomen: soft , no mass, normal active bowel sounds,  non-tender, no rebound tenderness Skin: No rashes, ulcers or skin lesions noted. No excessive hirsutism or acne noted.  Neurological: Appears alert and oriented and is a good historian. No gross abnormalities are noted. Psychological: Normal affect and mood. No signs of anxiety or depression noted.   Pelvic:              External genitalia: vulva /labia no lesions, Tanner stage 5, stiches present, no erythema, some edema, no drainage             Urethra: no prolapse             Vagina: +bright red blood in vault             Cervix: no lesions, significant cervical motion tenderness  Uterus: +tender, improved from last week             Adnexa: no mass,  non-tender               Rectovaginal: external exam normal     Impression:   The primary encounter diagnosis heavy vaginal bleeding after delivery. Diagnoses of Retained products of conception after delivery without hemorrhage and Uterine tenderness were also pertinent to this visit.    Plan:   - Postpartum pain and bleeding: DDx includes endometritis, retained products of conception, return of menses, AVM, accreta, underlying coagulopathy (no prior hx of heavy bleeding)  - Endometrial culture no growth - Ultrasound reassuring, with a thin homogenous stripe. No evidence of AVM or retained  products. Beta hcg negative.  - Plan for D&C in the OR with myosure, and removal of any focal tissue. I would like to visually evaluate the endometrium. No postpartum hemorrhage.

## 2016-09-04 ENCOUNTER — Encounter
Admission: RE | Disposition: A | Discharge status: Home / Self Care | Payer: Self-pay | Source: Ambulatory Visit | Attending: Obstetrics and Gynecology

## 2016-09-04 ENCOUNTER — Encounter: Payer: Self-pay | Admitting: *Deleted

## 2016-09-04 ENCOUNTER — Ambulatory Visit
Admission: RE | Admit: 2016-09-04 | Discharge: 2016-09-04 | Disposition: A | Discharge status: Home / Self Care | Payer: Managed Care, Other (non HMO) | Source: Ambulatory Visit | Attending: Obstetrics and Gynecology | Admitting: Obstetrics and Gynecology

## 2016-09-04 DIAGNOSIS — N939 Abnormal uterine and vaginal bleeding, unspecified: Secondary | ICD-10-CM | POA: Insufficient documentation

## 2016-09-04 DIAGNOSIS — Z01812 Encounter for preprocedural laboratory examination: Secondary | ICD-10-CM | POA: Insufficient documentation

## 2016-09-04 DIAGNOSIS — Z538 Procedure and treatment not carried out for other reasons: Secondary | ICD-10-CM | POA: Insufficient documentation

## 2016-09-04 HISTORY — DX: Other specified health status: Z78.9

## 2016-09-04 LAB — POCT PREGNANCY, URINE: Preg Test, Ur: NEGATIVE

## 2016-09-04 LAB — BASIC METABOLIC PANEL
Anion gap: 7 (ref 5–15)
BUN: 11 mg/dL (ref 6–20)
CHLORIDE: 106 mmol/L (ref 101–111)
CO2: 25 mmol/L (ref 22–32)
Calcium: 9.5 mg/dL (ref 8.9–10.3)
Creatinine, Ser: 0.81 mg/dL (ref 0.44–1.00)
GFR calc non Af Amer: >60 mL/min (ref >60)
Glucose, Bld: 92 mg/dL (ref 65–99)
POTASSIUM: 3.8 mmol/L (ref 3.5–5.1)
Sodium: 138 mmol/L (ref 135–145)

## 2016-09-04 LAB — CBC
HEMATOCRIT: 34.5 % — AB (ref 35.0–47.0)
Hemoglobin: 12.2 g/dL (ref 12.0–16.0)
MCH: 32 pg (ref 26.0–34.0)
MCHC: 35.3 g/dL (ref 32.0–36.0)
MCV: 90.6 fL (ref 80.0–100.0)
Platelets: 340 10*3/uL (ref 150–440)
RBC: 3.81 MIL/uL (ref 3.80–5.20)
RDW: 12.3 % (ref 11.5–14.5)
WBC: 6.5 10*3/uL (ref 3.6–11.0)

## 2016-09-04 LAB — TYPE AND SCREEN
ABO/RH(D): O POS
Antibody Screen: NEGATIVE

## 2016-09-04 SURGERY — DILATATION & CURETTAGE/HYSTEROSCOPY WITH MYOSURE
Anesthesia: Choice

## 2016-09-04 MED ORDER — LACTATED RINGERS IV SOLN: INTRAVENOUS | Status: DC

## 2016-09-04 MED ORDER — SODIUM CHLORIDE 0.9 % IV SOLN: INTRAVENOUS | Status: DC

## 2016-09-04 MED ORDER — GABAPENTIN 300 MG PO CAPS: 900.0000 mg | ORAL_CAPSULE | ORAL | Status: DC

## 2016-09-04 MED ORDER — FAMOTIDINE 20 MG PO TABS
ORAL_TABLET | ORAL | Status: AC
  Filled 2016-09-04: qty 1

## 2016-09-04 MED ORDER — GABAPENTIN 300 MG PO CAPS
ORAL_CAPSULE | ORAL | Status: DC
Start: 2016-09-04 — End: 2016-09-04
  Filled 2016-09-04: qty 3

## 2016-09-04 MED ORDER — FAMOTIDINE 20 MG PO TABS: 20.0000 mg | ORAL_TABLET | Freq: Once | ORAL | Status: DC

## 2016-09-04 SURGICAL SUPPLY — 16 items
CANISTER SUC SOCK COL 7IN (MISCELLANEOUS) ×2 IMPLANT
CANISTER SUCT 3000ML PPV (MISCELLANEOUS) ×2 IMPLANT
CATH ROBINSON RED A/P 16FR (CATHETERS) ×2 IMPLANT
DEVICE MYOSURE LITE (MISCELLANEOUS) IMPLANT
GLOVE BIO SURGEON STRL SZ7 (GLOVE) ×2 IMPLANT
GLOVE INDICATOR 7.5 STRL GRN (GLOVE) ×2 IMPLANT
GOWN STRL REUS W/ TWL LRG LVL3 (GOWN DISPOSABLE) ×2 IMPLANT
GOWN STRL REUS W/TWL LRG LVL3 (GOWN DISPOSABLE) ×2
KIT RM TURNOVER CYSTO AR (KITS) ×2 IMPLANT
PACK DNC HYST (MISCELLANEOUS) ×2 IMPLANT
PAD OB MATERNITY 4.3X12.25 (PERSONAL CARE ITEMS) ×2 IMPLANT
PAD PREP 24X41 OB/GYN DISP (PERSONAL CARE ITEMS) ×2 IMPLANT
SOL .9 NS 3000ML IRR  AL (IV SOLUTION) ×1
SOL .9 NS 3000ML IRR UROMATIC (IV SOLUTION) ×1 IMPLANT
TUBING CONNECTING 10 (TUBING) ×2 IMPLANT
TUBING HYSTEROSCOPY DOLPHIN (MISCELLANEOUS) ×2 IMPLANT

## 2016-09-04 NOTE — Progress Notes (Signed)
On admission to preop, pt states she is no longer bleeding, though did have approx a menstrual period amount of normal blood overnight. After discussion and review of her images and discussing labs and ddx, we have decided to postpone her surgery and trial an estrogen taper.  Script for estrogen taper and zofran sent to pharmacy.

## 2016-09-05 LAB — COAG STUDIES INTERP REPORT

## 2016-09-05 LAB — VON WILLEBRAND PANEL
COAGULATION FACTOR VIII: 105 % (ref 57–163)
Ristocetin Co-factor, Plasma: 51 % (ref 50–200)
VON WILLEBRAND ANTIGEN, PLASMA: 54 % (ref 50–200)

## 2017-07-01 IMAGING — US US OB COMP +14 WK
1 series · 13 of 28 positions shown · non-contrast
Comparison: none

CLINICAL DATA: Pregnancy.  Fetal survey .

EXAM:
OBSTETRICAL ULTRASOUND >14 WKS

[Series 1: us ob comp +14 wk · 0.20mm/px · 13 of 93 slices shown]
[im 4/93]
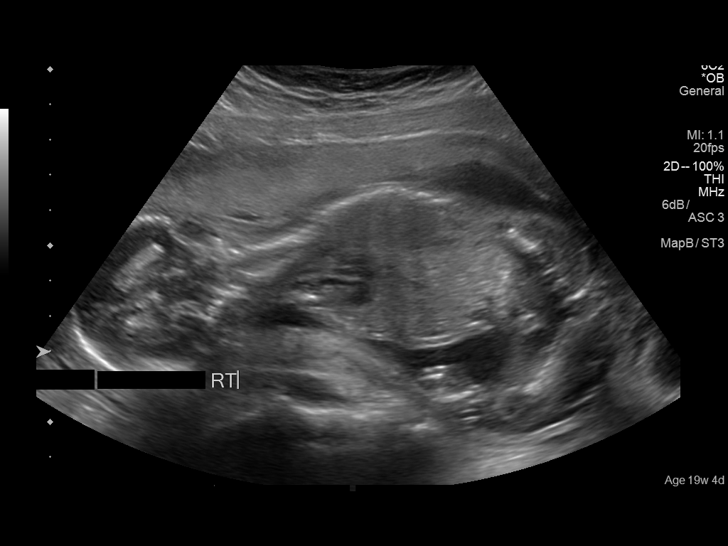
[im 11/93]
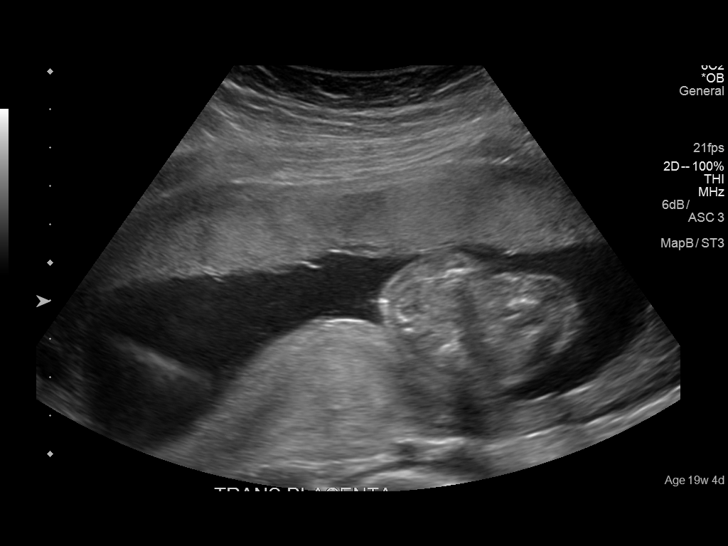
[im 18/93]
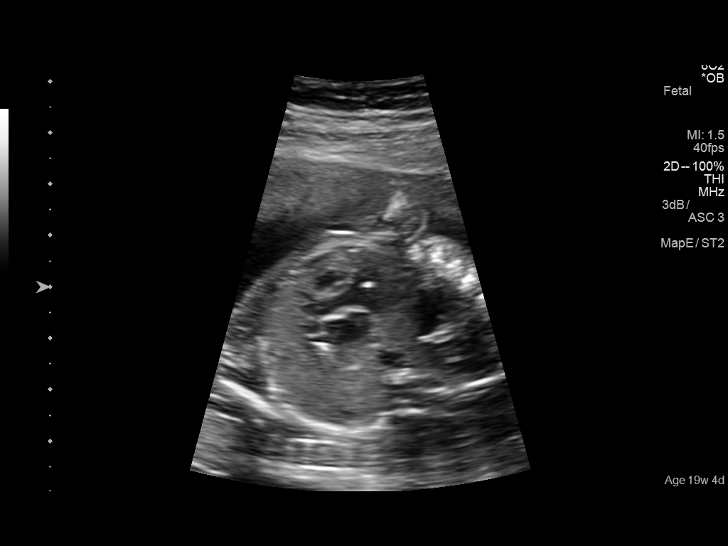
[im 24/93]
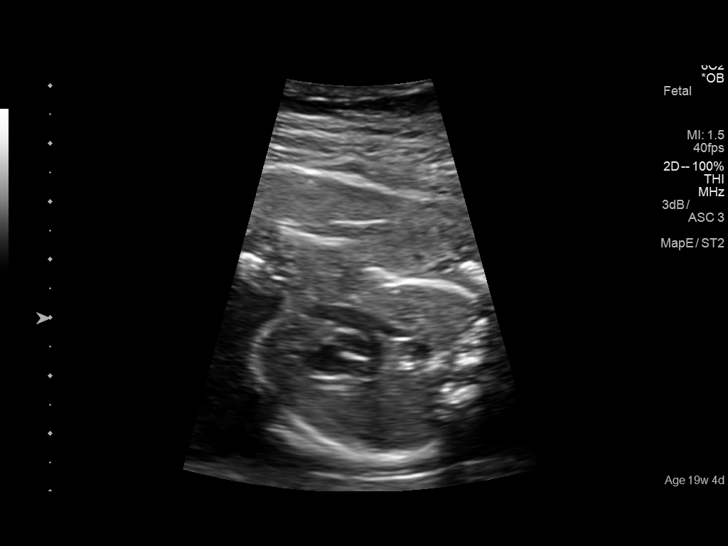
[im 31/93]
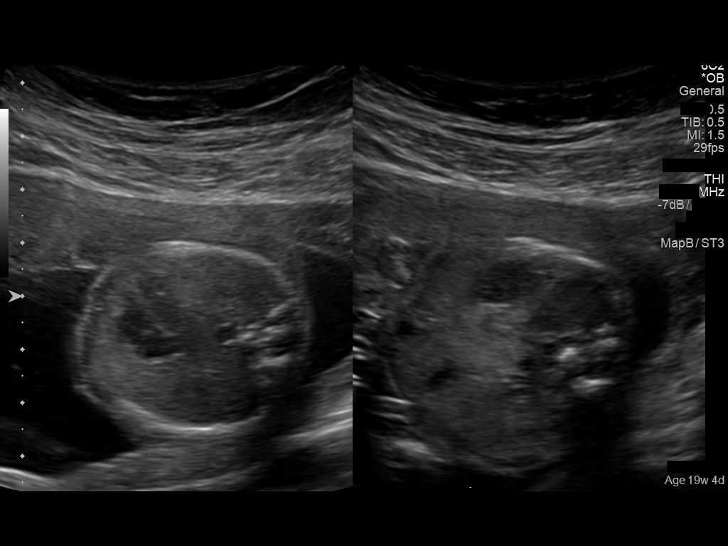
[im 38/93]
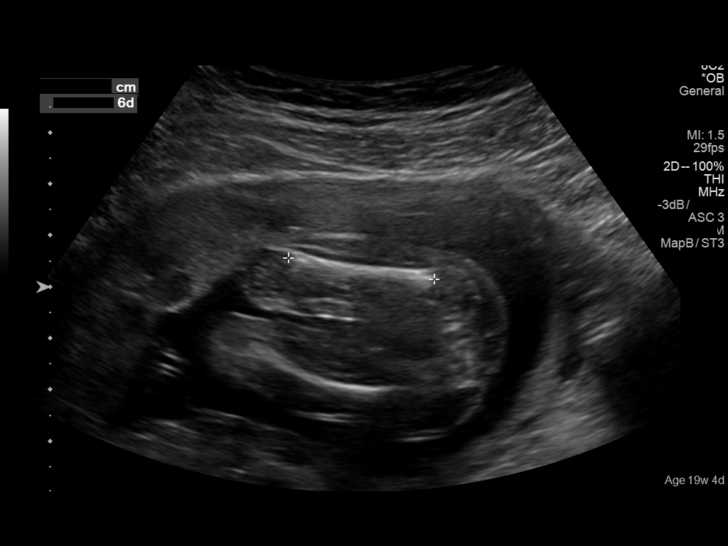
[im 48/93]
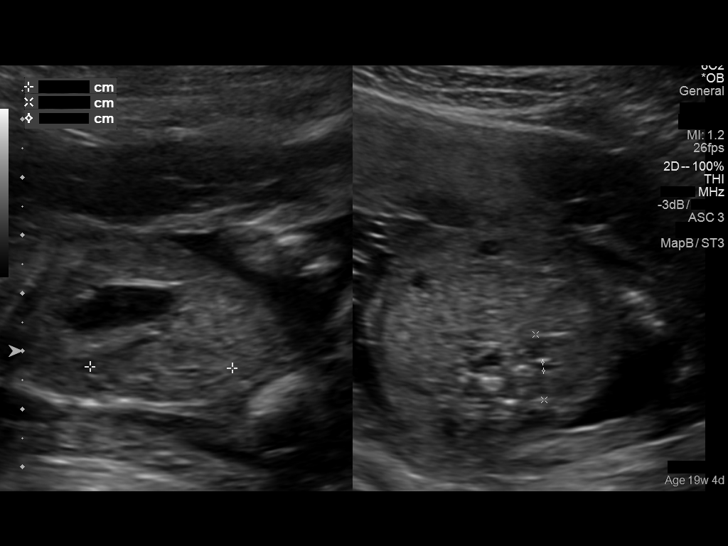
[im 55/93]
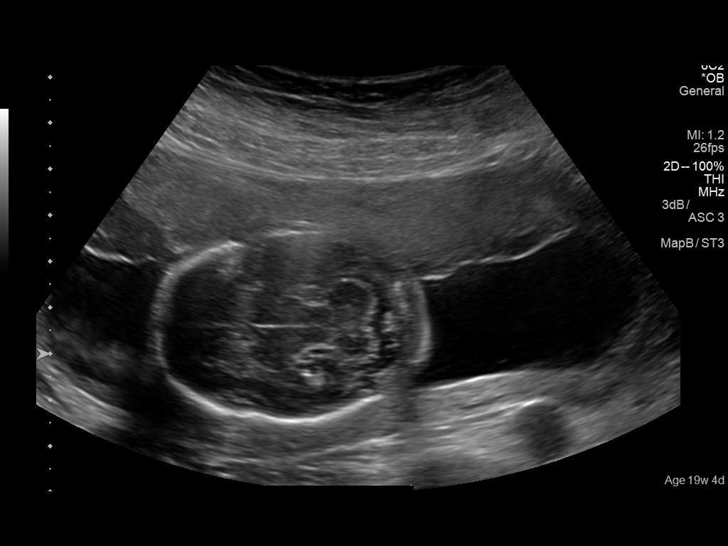
[im 62/93]
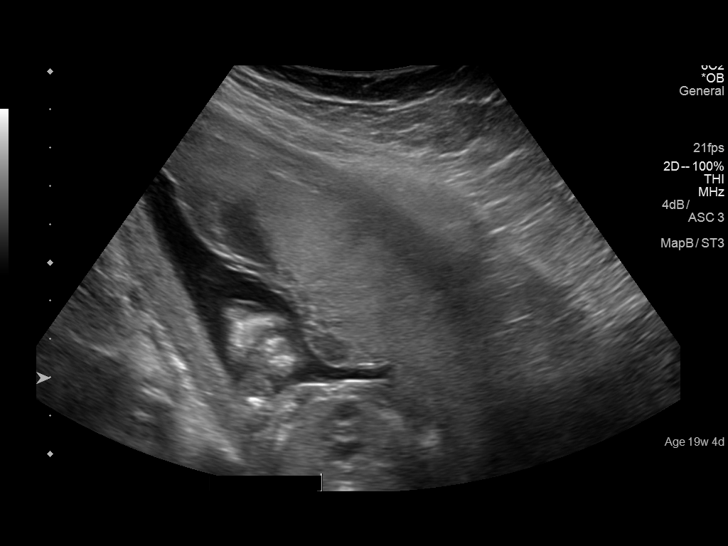
[im 69/93]
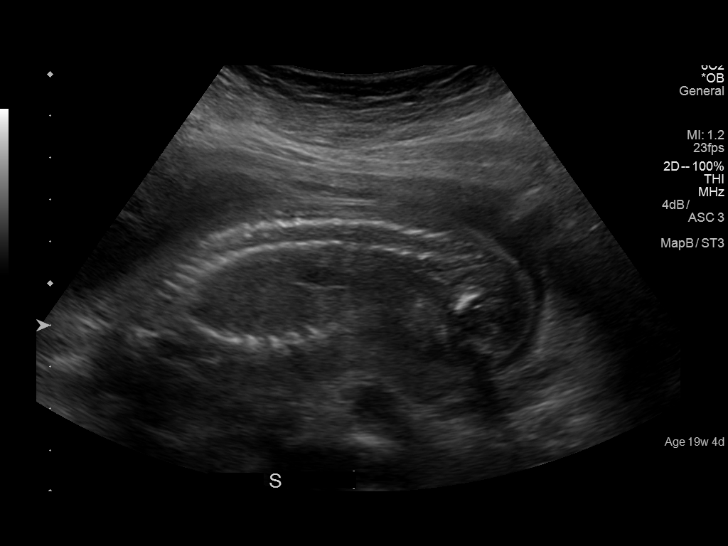
[im 75/93]
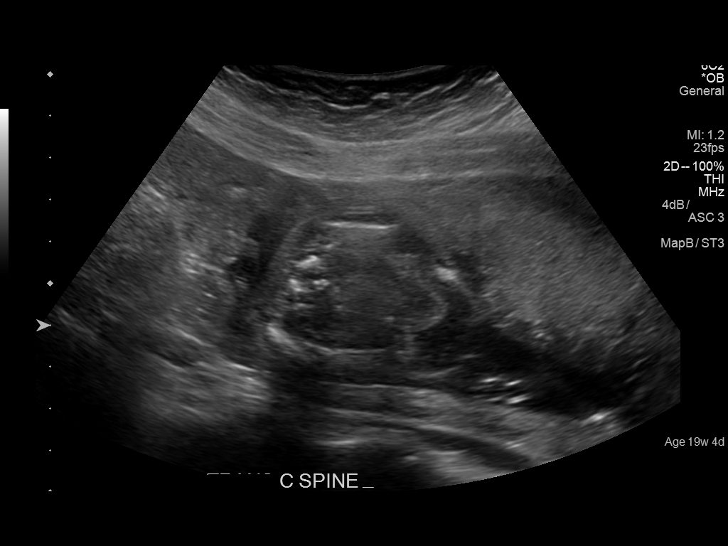
[im 82/93]
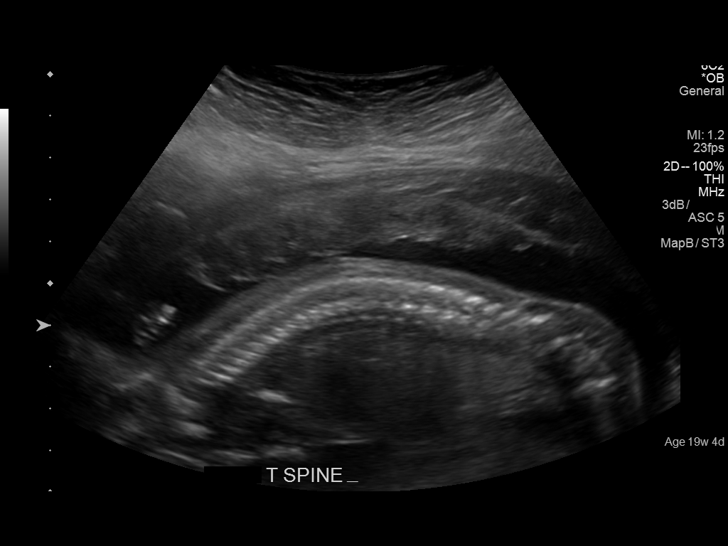
[im 89/93]
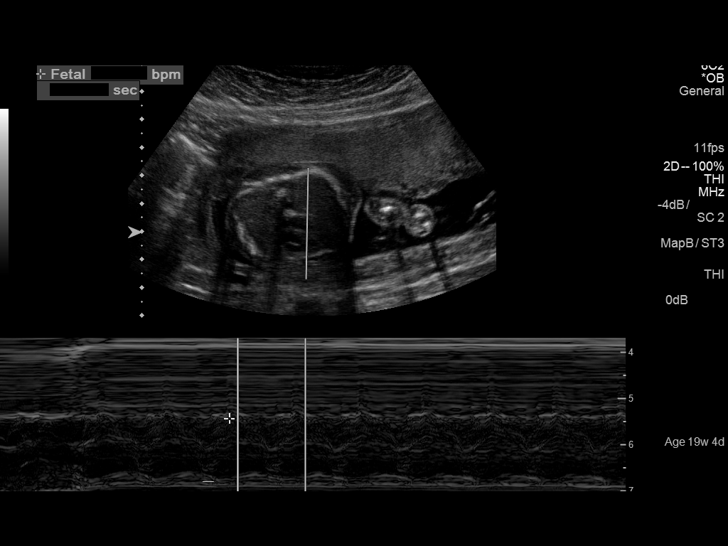

[13 of 28 positions shown; findings below may reference images not displayed]

FINDINGS: Number of Fetuses: 1

Heart Rate:  Variable at 150-169 bpm

Movement: Present.

Presentation: Transverse

Previa: No

Placental Location: Anterior

Amniotic Fluid (Subjective): 4.5 cm

Amniotic Fluid (Objective):

FETAL BIOMETRY

BPD:  4.2cm 18w 5d

HC:    16 point setcm  19w   3d

AC:   14.1cm  19w   3d

FL:   2.9cm  18w   6d

Current Mean GA: 19w 1d              US EDC: 07/26/2016

FETAL ANATOMY

Lateral Ventricles: Visualized

Thalami/CSP: Visualized

Posterior Fossa:  Visualized

Nuchal Region: Visualized    NFT= 4.2mm

Upper Lip: Visualized

Spine: Visualized

4 Chamber Heart on Left: Visualized. Echogenic focus noted in the
left ventricle.

LVOT: Visualized

RVOT: Visualized

Stomach on Left: Visualized

3 Vessel Cord: Visualized

Cord Insertion site: Visualized

Kidneys: Visualized

Bladder: Visualized

Extremities: Visualized

Maternal Findings:

Cervix:  3.8 cm and closed.
IMPRESSION: 1.  Single viable intrauterine pregnancy at 19 weeks 1 day.

2.Echogenic focus in the left ventricle. Variable heart rate.
Ultrasound at a tertiary care center suggested for further
evaluation.
